# Patient Record
Sex: Female | Born: 2000
Health system: Southern US, Community
[De-identification: ages and names within clinical notes are randomized; demographics above are authoritative.]

## PROBLEM LIST (undated history)

## (undated) DIAGNOSIS — J45909 Unspecified asthma, uncomplicated: Secondary | ICD-10-CM

---
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
## 2020-01-24 DIAGNOSIS — Z20822 Contact with and (suspected) exposure to covid-19: Secondary | ICD-10-CM | POA: Diagnosis not present

## 2020-02-05 DIAGNOSIS — H5213 Myopia, bilateral: Secondary | ICD-10-CM | POA: Diagnosis not present

## 2020-07-24 ENCOUNTER — Encounter (HOSPITAL_COMMUNITY): Payer: Self-pay | Admitting: Emergency Medicine

## 2020-07-24 ENCOUNTER — Other Ambulatory Visit: Payer: Self-pay

## 2020-07-24 ENCOUNTER — Emergency Department (HOSPITAL_COMMUNITY)
Admission: EM | Admit: 2020-07-24 | Discharge: 2020-07-24 | Disposition: A | Discharge status: Home / Self Care | Payer: Medicaid Other | Attending: Emergency Medicine | Admitting: Emergency Medicine

## 2020-07-24 DIAGNOSIS — J020 Streptococcal pharyngitis: Secondary | ICD-10-CM | POA: Diagnosis not present

## 2020-07-24 DIAGNOSIS — J45909 Unspecified asthma, uncomplicated: Secondary | ICD-10-CM | POA: Diagnosis not present

## 2020-07-24 DIAGNOSIS — F17290 Nicotine dependence, other tobacco product, uncomplicated: Secondary | ICD-10-CM | POA: Diagnosis not present

## 2020-07-24 DIAGNOSIS — J029 Acute pharyngitis, unspecified: Secondary | ICD-10-CM | POA: Diagnosis present

## 2020-07-24 LAB — GROUP A STREP BY PCR: Group A Strep by PCR: DETECTED — AB

## 2020-07-24 MED ORDER — PENICILLIN G BENZATHINE 1200000 UNIT/2ML IM SUSY
1.2000 10*6.[IU] | PREFILLED_SYRINGE | Freq: Once | INTRAMUSCULAR | Status: AC
  Administered 2020-07-24: 1.2 10*6.[IU] via INTRAMUSCULAR
  Filled 2020-07-24: qty 2

## 2020-07-24 NOTE — ED Triage Notes (Signed)
Patient states she started having gland swelling today with throat irritation. Denies pain, fever, or other symptoms.

## 2020-07-24 NOTE — Discharge Instructions (Addendum)
You were seen in the emergency department and diagnosed with strep throat.  You were given a shot of Penicillin which is an antibiotic used to treat the infection  You should gradually feel better over the next few days. Take Tylenol and Ibuprofen for fever and pain. Follow up with your primary care provider in the next 1 week if you are not feeling better, if you do not have a primary care provider one is provided in your discharge instructions. Return to the emergency department for any new or worsening symptoms including but not limited to inability to open your mouth, inability to move your neck, worsening pain, change in your voice, inability to swallow your own saliva, drooling, or any other concerns.

## 2020-07-24 NOTE — ED Provider Notes (Signed)
[facility]-EMERGENCY DEPT Provider Note   CSN: [id number] Arrival date & time: 07/24/20  0110     History Chief Complaint  Patient presents with   Sore Throat    Janice Peck is a 20 y.o. femalewho presents to the ED with complaints of sore throat that began today. Throat feels dry and sore, no alleviating/aggravating factors. Discomfort currently mild. Denies fever, vomiting, dyspnea, cough, or congestion. Denies chance of pregnancy.  HPI     Past Medical History:  Diagnosis Date   Asthma     Patient Active Problem List   Diagnosis Date Noted   Nexplanon in place 10/27/2018    History reviewed. No pertinent surgical history.   OB History     Gravida  0   Para  0   Term  0   Preterm  0   AB  0   Living  0      SAB  0   IAB  0   Ectopic  0   Multiple  0   Live Births  0           No family history on file.  Social History   Tobacco Use   Smoking status: Light Smoker    Pack years: 0.00    Types: Cigars   Smokeless tobacco: Never  Vaping Use   Vaping Use: Never used  Substance Use Topics   Alcohol use: No   Drug use: No    Home Medications Prior to Admission medications   Medication Sig Start Date End Date Taking? Authorizing Provider  Etonogestrel (NEXPLANON [place]) Inject into the skin.    [provider]  megestrol (MEGACE) 40 MG tablet Take morning and night for 2 weeks and then taper to once a day for 4 days. 10/28/18   Burleson, Brand Males, NP  omeprazole (PRILOSEC) 20 MG capsule Take 1 capsule (20 mg total) by mouth daily. Patient not taking: Reported on 10/28/2018 05/22/18   Arby Barrette, MD    Allergies    Other  Review of Systems   Review of Systems  Constitutional:  Negative for fever.  HENT:  Positive for sore throat. Negative for congestion and voice change.   Respiratory:  Negative for shortness of breath.   Cardiovascular:  Negative for chest pain.  Gastrointestinal:  Negative for  abdominal pain and vomiting.  All other systems reviewed and are negative.  Physical Exam Updated Vital Signs BP 136/68 (BP Location: Left Arm)   Pulse 91   Temp 98.1 F (36.7 C) (Oral)   Resp 16   Ht 5\' 8"  (1.727 m)   Wt 86.2 kg   SpO2 100%   BMI 28.89 kg/m   Physical Exam Vitals and nursing note reviewed.  Constitutional:      General: She is not in acute distress.    Appearance: She is well-developed.  HENT:     Head: Normocephalic and atraumatic.     Right Ear: Ear canal normal. Tympanic membrane is not perforated, erythematous, retracted or bulging.     Left Ear: Ear canal normal. Tympanic membrane is not perforated, erythematous, retracted or bulging.     Ears:     Comments: No mastoid erythema/swelling/tenderness.     Nose:     Right Sinus: No maxillary sinus tenderness or frontal sinus tenderness.     Left Sinus: No maxillary sinus tenderness or frontal sinus tenderness.     Mouth/Throat:     Pharynx: Uvula midline. Posterior oropharyngeal erythema  present. No oropharyngeal exudate.     Comments: Posterior oropharynx is symmetric appearing. Patient tolerating own secretions without difficulty. No trismus. No drooling. No hot potato voice. No swelling beneath the tongue, submandibular compartment is soft.  Eyes:     General:        Right eye: No discharge.        Left eye: No discharge.     Conjunctiva/sclera: Conjunctivae normal.     Pupils: Pupils are equal, round, and reactive to light.  Cardiovascular:     Rate and Rhythm: Normal rate and regular rhythm.     Heart sounds: No murmur heard. Pulmonary:     Effort: Pulmonary effort is normal. No respiratory distress.     Breath sounds: Normal breath sounds. No wheezing, rhonchi or rales.  Abdominal:     General: There is no distension.     Palpations: Abdomen is soft.     Tenderness: There is no abdominal tenderness.  Musculoskeletal:     Cervical back: Normal range of motion and neck supple. No edema or  rigidity.  Lymphadenopathy:     Cervical: Cervical adenopathy present.  Skin:    General: Skin is warm and dry.     Findings: No rash.  Neurological:     Mental Status: She is alert.  Psychiatric:        Behavior: Behavior normal.    ED Results / Procedures / Treatments   Labs (all labs ordered are listed, but only abnormal results are displayed) Labs Reviewed  GROUP A STREP BY PCR - Abnormal; Notable for the following components:      Result Value   Group A Strep by PCR DETECTED (*)    All other components within normal limits    EKG None  Radiology No results found.  Procedures Procedures   Medications Ordered in ED Medications  penicillin g benzathine (BICILLIN LA) 1200000 UNIT/2ML injection 1.2 Million Units (has no administration in time range)    ED Course  I have reviewed the triage vital signs and the nursing notes.  Pertinent labs & imaging results that were available during my care of the patient were reviewed by me and considered in my medical decision making (see chart for details).    MDM Rules/Calculators/A&P                         Presents with complaint of sore throat.  Patient is nontoxic-appearing, vitals are within normal limits.  On exam patient with posterior oropharyngeal erythema and anterior cervical lymphadenopathy.  Strep test is positive.  Treated in the emergency department with IM Bicillin  Exam non concerning for PTA or RPA, there is no trismus, uvular deviation, or hot potato voice. Patient is tolerating own secretions without difficulty, full ROM of the neck, submandibular compartment is soft. Recommended use of Tylenol and Ibuprofen for any continued discomfort or fevers, offered decadron in the ED which patient declined. I discussed results, treatment plan, need for PCP follow-up, and return precautions with the patient and her Nana via phone.  Provided opportunity for questions, patient & her Laney Potash confirmed understanding and are in  agreement with plan.   Final Clinical Impression(s) / ED Diagnoses Final diagnoses:  Strep throat    Rx / DC Orders ED Discharge Orders     None        Cherly Anderson, PA-C 07/24/20 0249    Zadie Rhine, MD 07/24/20 [phone]

## 2020-07-25 ENCOUNTER — Telehealth: Payer: Self-pay

## 2020-07-25 NOTE — Telephone Encounter (Signed)
Transition Care Management Follow-up Telephone Call Date of discharge and from where: 07/24/2020-[facility] How have you been since you were released from the hospital? Patient is feeling a little better. Not worse  Any questions or concerns? No  Items Reviewed: Did the pt receive and understand the discharge instructions provided? Yes  Medications obtained and verified? Yes  Other? No  Any new allergies since your discharge? No  Dietary orders reviewed? N/A Do you have support at home? Yes   Home Care and Equipment/Supplies: Were home health services ordered? not applicable If so, what is the name of the agency? N/A  Has the agency set up a time to come to the patient's home? not applicable Were any new equipment or medical supplies ordered?  No What is the name of the medical supply agency? N/A Were you able to get the supplies/equipment? not applicable Do you have any questions related to the use of the equipment or supplies? No  Functional Questionnaire: (I = Independent and D = Dependent) ADLs: I  Bathing/Dressing- I  Meal Prep- I  Eating- I  Maintaining continence- I  Transferring/Ambulation- I  Managing Meds- I  Follow up appointments reviewed:  PCP Hospital f/u appt confirmed? No  Pt advised to contact [facility]. Specialist Hospital f/u appt confirmed? No   Are transportation arrangements needed? No  If their condition worsens, is the pt aware to call PCP or go to the Emergency Dept.? Yes Was the patient provided with contact information for the PCP's office or ED? Yes Was to pt encouraged to call back with questions or concerns? Yes

## 2020-08-23 ENCOUNTER — Other Ambulatory Visit: Payer: Self-pay

## 2020-08-23 NOTE — Patient Outreach (Signed)
Care Coordination  08/23/2020  Janice Peck 05-14-2000 [id number]   Medicaid Managed Care   Unsuccessful Outreach Note  08/23/2020 Name: Janice Peck MRN: [id number] DOB: 30-Nov-2000  Referred by: Patient, No Pcp Per (Inactive) Reason for referral : High Risk Managed Medicaid (MM Social Work Unsuccessful [employer])   An unsuccessful telephone outreach was attempted today. The patient was referred to the case management team for assistance with care management and care coordination.   Follow Up Plan: The care management team will reach out to the patient again over the next 7 days.   Gus Puma, BSW, [place] [facility]  [employer] Risk Managed Medicaid Team  [phone]

## 2020-08-23 NOTE — Patient Instructions (Signed)
Visit Information  Ms. Janice Peck  - as a part of your Medicaid benefit, you are eligible for care management and care coordination services at no cost or copay. I was unable to reach you by phone today but would be happy to help you with your health related needs. Please feel free to call me @ [phone].   A member of the Managed Medicaid care management team will reach out to you again over the next 7 days.   Gus Puma, BSW, [place] [facility]  [employer] Risk Managed Medicaid Team  [phone]

## 2020-09-03 ENCOUNTER — Telehealth: Payer: Self-pay

## 2020-09-03 NOTE — Patient Outreach (Signed)
Care Coordination  09/03/2020  Janice Peck 02-12-2000 [id number]   Medicaid Managed Care   Unsuccessful Outreach Note  09/03/2020 Name: Janice Peck MRN: [id number] DOB: 06-14-2000  Referred by: Patient, No Pcp Per (Inactive) Reason for referral : High Risk Managed Medicaid (MM Social Work Unsuccessful Telephone outreach)   A second unsuccessful telephone outreach was attempted today. The patient was referred to the case management team for assistance with care management and care coordination.   Follow Up Plan: The care management team will reach out to the patient again over the next 7 days.   Gus Puma, BSW, [place] [facility]  [employer] Risk Managed Medicaid Team  [phone]

## 2020-09-13 ENCOUNTER — Telehealth: Payer: Self-pay

## 2020-09-13 NOTE — Patient Outreach (Signed)
Care Coordination  09/13/2020  Janice Peck 06-14-2000 [id number]   Medicaid Managed Care   Unsuccessful Outreach Note  09/13/2020 Name: Janice Peck MRN: [id number] DOB: Feb 20, 2000  Referred by: Patient, No Pcp Per (Inactive) Reason for referral : High Risk Managed Medicaid (MM Social Work Unsuccessful [employer])   Third unsuccessful telephone outreach was attempted today. The patient was referred to the case management team for assistance with care management and care coordination. The patient's primary care provider has been notified of our unsuccessful attempts to make or maintain contact with the patient. The care management team is pleased to engage with this patient at any time in the future should he/she be interested in assistance from the care management team.   Follow Up Plan: We have been unable to make contact with the patient for follow up. The care management team is available to follow up with the patient after provider conversation with the patient regarding recommendation for care management engagement and subsequent re-referral to the care management team.  Gus Puma, BSW, [facility]  [facility]  High Risk Managed Medicaid Team  [phone]

## 2020-09-13 NOTE — Patient Instructions (Signed)
Visit Information  Ms. Janice Peck  - as a part of your Medicaid benefit, you are eligible for care management and care coordination services at no cost or copay. I was unable to reach you by phone today but would be happy to help you with your health related needs. Please feel free to call me @ [phone].    Gus Puma, BSW, [place] [facility]  [employer] Risk Managed Medicaid Team  [phone]

## 2020-10-25 DIAGNOSIS — J00 Acute nasopharyngitis [common cold]: Secondary | ICD-10-CM | POA: Diagnosis not present

## 2020-10-25 DIAGNOSIS — Z20822 Contact with and (suspected) exposure to covid-19: Secondary | ICD-10-CM | POA: Diagnosis not present

## 2020-11-08 ENCOUNTER — Ambulatory Visit: Payer: Medicaid Other

## 2020-11-12 ENCOUNTER — Ambulatory Visit: Payer: Medicaid Other

## 2020-11-13 ENCOUNTER — Ambulatory Visit (INDEPENDENT_AMBULATORY_CARE_PROVIDER_SITE_OTHER): Payer: Medicaid Other

## 2020-11-13 ENCOUNTER — Other Ambulatory Visit: Payer: Self-pay

## 2020-11-13 ENCOUNTER — Other Ambulatory Visit (HOSPITAL_COMMUNITY)
Admission: RE | Admit: 2020-11-13 | Discharge: 2020-11-13 | Disposition: A | Discharge status: Home / Self Care | Payer: Medicaid Other | Source: Ambulatory Visit | Attending: Obstetrics & Gynecology | Admitting: Obstetrics & Gynecology

## 2020-11-13 VITALS — BP 117/74 | HR 80 | Wt 190.0 lb

## 2020-11-13 DIAGNOSIS — N898 Other specified noninflammatory disorders of vagina: Secondary | ICD-10-CM

## 2020-11-13 DIAGNOSIS — B851 Pediculosis due to Pediculus humanus corporis: Secondary | ICD-10-CM | POA: Diagnosis present

## 2020-11-13 DIAGNOSIS — Z113 Encounter for screening for infections with a predominantly sexual mode of transmission: Secondary | ICD-10-CM | POA: Diagnosis not present

## 2020-11-13 DIAGNOSIS — N926 Irregular menstruation, unspecified: Secondary | ICD-10-CM | POA: Diagnosis not present

## 2020-11-13 LAB — POCT URINE PREGNANCY: Preg Test, Ur: NEGATIVE

## 2020-11-13 NOTE — Progress Notes (Signed)
Ms. Parkerson presents today for UPT and all STD testing. She has no unusual complaints. Pt is not trying to conceive and she declines BC.  LMP: 10/26/2020 - aprx.per pt     OBJECTIVE: Appears well, in no apparent distress.  OB History     Gravida  0   Para  0   Term  0   Preterm  0   AB  0   Living  0      SAB  0   IAB  0   Ectopic  0   Multiple  0   Live Births  0          Home UPT Result: n/a In-Office UPT result: negative  I have reviewed the patient's medical, obstetrical, social, and family histories, and medications.   ASSESSMENT: negative pregnancy test  PLAN Prenatal care to be completed at:  Take home UPT in 2 weeks  If positive, contact the office to start PN care.

## 2020-11-14 LAB — CERVICOVAGINAL ANCILLARY ONLY
Bacterial Vaginitis (gardnerella): POSITIVE — AB
Candida Glabrata: NEGATIVE
Candida Vaginitis: NEGATIVE
Chlamydia: NEGATIVE
Comment: NEGATIVE
Comment: NEGATIVE
Comment: NEGATIVE
Comment: NEGATIVE
Comment: NEGATIVE
Comment: NORMAL
Neisseria Gonorrhea: NEGATIVE
Trichomonas: NEGATIVE

## 2020-11-14 LAB — HEPATITIS B SURFACE ANTIGEN: Hepatitis B Surface Ag: NEGATIVE

## 2020-11-14 LAB — HIV ANTIBODY (ROUTINE TESTING W REFLEX): HIV Screen 4th Generation wRfx: NONREACTIVE

## 2020-11-14 LAB — HEPATITIS C ANTIBODY: Hep C Virus Ab: <0.1 s/co ratio (ref 0.0–0.9)

## 2020-11-14 LAB — RPR: RPR Ser Ql: NONREACTIVE

## 2020-11-18 ENCOUNTER — Other Ambulatory Visit: Payer: Self-pay | Admitting: Obstetrics & Gynecology

## 2020-11-18 DIAGNOSIS — B9689 Other specified bacterial agents as the cause of diseases classified elsewhere: Secondary | ICD-10-CM

## 2020-11-18 DIAGNOSIS — N760 Acute vaginitis: Secondary | ICD-10-CM

## 2020-11-18 MED ORDER — METRONIDAZOLE 500 MG PO TABS: 500.0000 mg | ORAL_TABLET | Freq: Two times a day (BID) | ORAL | 0 refills | Status: AC

## 2020-11-18 NOTE — Progress Notes (Signed)
Current Meds  Medication Sig   metroNIDAZOLE (FLAGYL) 500 MG tablet Take 1 tablet (500 mg total) by mouth 2 (two) times daily.

## 2022-02-06 DIAGNOSIS — Z3202 Encounter for pregnancy test, result negative: Secondary | ICD-10-CM | POA: Diagnosis not present

## 2022-02-06 DIAGNOSIS — N760 Acute vaginitis: Secondary | ICD-10-CM | POA: Diagnosis not present
